# Patient Record
Sex: Male | Born: 2000 | Race: White | Hispanic: No | Marital: Single | Smoking: Never smoker
Health system: Southern US, Community
[De-identification: ages and names within clinical notes are randomized; demographics above are authoritative.]

## PROBLEM LIST (undated history)

## (undated) DIAGNOSIS — G600 Hereditary motor and sensory neuropathy: Principal | ICD-10-CM

## (undated) HISTORY — DX: Hereditary motor and sensory neuropathy: G60.0

---
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
## 2018-11-19 ENCOUNTER — Ambulatory Visit (INDEPENDENT_AMBULATORY_CARE_PROVIDER_SITE_OTHER): Payer: Self-pay | Admitting: Pediatrics

## 2018-12-03 ENCOUNTER — Ambulatory Visit (INDEPENDENT_AMBULATORY_CARE_PROVIDER_SITE_OTHER): Payer: Medicaid Other

## 2018-12-10 ENCOUNTER — Ambulatory Visit (INDEPENDENT_AMBULATORY_CARE_PROVIDER_SITE_OTHER): Payer: Medicaid Other | Admitting: Pediatrics

## 2018-12-10 ENCOUNTER — Encounter (INDEPENDENT_AMBULATORY_CARE_PROVIDER_SITE_OTHER): Payer: Self-pay | Admitting: Pediatrics

## 2018-12-10 VITALS — BP 120/90 | HR 82 | Ht 68.0 in | Wt 180.0 lb

## 2018-12-10 DIAGNOSIS — G600 Hereditary motor and sensory neuropathy: Secondary | ICD-10-CM

## 2018-12-10 NOTE — Progress Notes (Signed)
Patient: Mitchell Vasquez. MRN: [id number] Sex: male DOB: 2000/10/24  Provider: Wyline Copas, MD Location of Care: [facility]  Note type: Routine return visit  History of Present Illness: History from: father and patient  Enes Rokosz. is a 18 y.o. male with Charcot [name] Tooth diagnosed at age 1 who presents for routine follow up.  His primary concern today is that he is outgrown his AFOs and needs a new prescription.  His current AFOs are tight, but father denies any ulcers on his feet.  [name] reports that his neuropathy has slowly progressed and that his hand clawing is now worse.  He loves to play Xbox and his symptoms are not preventing him from doing so.  He continues to use an electric wheelchair and gets out of it to use the restroom and take a bath.  He supports himself with his walker or holding onto the wall.  This is no more difficult than it was in the past.  He had no falls at home.  He has remained very independent and is still able to perform all of his household ADLs.  He was previously receiving physical therapy at [facility], but is since graduated and is no longer receiving PT.  He did think that PT was helpful and would like to continue if able.  He was also previously receiving occupational therapy for job skills, but he thinks this is no longer necessary.  He is now enrolled in [employer] in an animal care program and hopes to be employed after finishing his program in September 2021.  Review of Systems: A complete review of systems was negative except as noted above.  Past Medical History Diagnosis Date  . 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 Initially this was thought to be of  unknown significance that over time it has become established as etiologic.  Birth History 9 lbs. 2 oz. infant born at 96 months gestational age to a 20 year old g 3 p 1 1 2  male. Gestation was uncomplicated cesarian section  Nursery Course was uncomplicated Growth and Development was recalled as abnormal(delayed walking)  Behavior History none  Surgical History History reviewed. No pertinent surgical history.  Family History family history is not on file. Mom T2DM (started after pregnancy), bipolar disorder Family history is negative for migraines, seizures, intellectual disabilities, blindness, deafness, birth defects, chromosomal disorder, or autism.  Social History Socioeconomic History  . Marital status: Single  . Years of education:  40  . Highest education level:  High school graduate  Occupational History  .  Not working  Social Needs  . Financial resource strain: Not on file  . Food insecurity:    Worry: Not on file    Inability: Not on file  . Transportation needs:    Medical: Not on file    Non-medical: Not on file  Tobacco Use  . Smoking status: Passive Smoke Exposure - Never Smoker  . Smokeless tobacco: Never Used  . Tobacco comment: Mom smokes inside  Substance and Sexual Activity  . Alcohol use: Not on file  . Drug use: Not on file  . 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    [name] lives with his parents and siblings.    [name] enjoys playing video games and eating.   No Known Allergies  Physical Exam BP 120/90   Pulse 82   Ht 5' 8"  (1.727 m)   Wt 180 lb (81.6 kg)   BMI 27.37 kg/m   General: alert, well developed, well nourished, in no acute distress, blond hair, brown eyes, right handed Head: normocephalic, no dysmorphic features Ears, Nose and Throat: Otoscopic: tympanic membranes normal; pharynx: oropharynx is pink without exudates or  tonsillar hypertrophy Neck: supple, full range of motion, no cranial or cervical bruits Respiratory: auscultation clear Cardiovascular: no murmurs, pulses are normal Musculoskeletal: Extremities appear atrophied, bilateral claw deformity of hands Skin: feet warm and well perfused. No ulcers or skin irritation of feet or between toes.  Neurologic Exam  Mental Status: alert; oriented to person, place and year; knowledge is normal for age; language is normal Cranial Nerves: visual fields are full to double simultaneous stimuli; extraocular movements are full and conjugate; pupils are round reactive to light; funduscopic examination shows sharp disc margins with normal vessels; symmetric facial strength; midline tongue and uvula Motor: Strength 5/5 in biceps, triceps. Strength 3/5 in flexion and extension of wrists. Strength 2/5 in flexion and extension of fingers. Strength 0/5 in plantar flexion and dorsiflexion of ankles bilaterally. Strength 0/5 in flexion and extension of knees. Sensory: no sensation to light tough on bilateral legs distal to the knees. Sensation otherwise intact. Coordination: Difficult to test Gait and Station: not assessed, in wheelchair Reflexes: Absent  Assessment 1. 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  Discussion Progressive worsening of bilateral claw hand contraction. Still able to perform ADLs, maintain independence, participate in hobbies. In need of new AFOs and desires to return to PT.   Plan - Ambulatory Referral for DME, [place] - Father to reach out to PT at [facility], will place order upon request - Return visit in 1 year for routine evaluation, I will see him sooner based on clinical need   Medication List  No prescribed medications.   The medication list was reviewed and reconciled. All changes or newly prescribed medications were explained.  A complete medication list was provided to  the patient/caregiver.  Harlon Ditty, MD [facility], PGY-1  Greater than 50% of a 25-minute visit was spent in counseling and coordination of care concerning his neuromuscular disorder.  I supervised Dr. Ralph Dowdy and agree with his note except as amended.  I performed physical examination, participated in history taking, and guided decision making.  Jodi Geralds MD

## 2018-12-10 NOTE — Progress Notes (Deleted)
Patient: Mitchell Vasquez. MRN: [id number] Sex: male DOB: 2001-03-22  Provider: Ellison Carwin, MD Location of Care: [facility]  Note type: Routine return visit  History of Present Illness: Referral Source: Mitchell Guard, MD History from: father, patient and [facility] chart Chief Complaint: Peripheral neuropathy/Charcot Marie-Tooth disease  Mitchell Vasquez. is a 18 y.o. male who ***  Review of Systems: A complete review of systems was unremarkable.  Past Medical History Past Medical History:  Diagnosis Date  . 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    ***  Birth History *** lbs. *** oz. infant born at *** weeks gestational age to a *** year old g *** p *** *** *** *** male. Gestation was {Complicated/Uncomplicated Pregnancy:20185} Mother received {CN Delivery analgesics:210120005}  {method of delivery:313099} Nursery Course was {Complicated/Uncomplicated:20316} Growth and Development was {cn recall:210120004}  Behavior History {Symptoms; behavioral problems:18883}  Surgical History History reviewed. No pertinent surgical history.  Family History family history is not on file. Family history is negative for migraines, seizures, intellectual disabilities, blindness, deafness, birth defects, chromosomal disorder, or autism.  Social History Social History   Socioeconomic History  . Marital status: Single    Spouse name: Not on file  . Number of children: Not on file  . Years of education: Not on file  . Highest education level: Not on file  Occupational History  . Not on file  Social Needs  . Financial resource strain: Not on file  . Food insecurity:    Worry: Not on file    Inability: Not on file  . Transportation needs:    Medical: Not on file    Non-medical: Not on file  Tobacco Use  .  Smoking status: Passive Smoke Exposure - Never Smoker  . Smokeless tobacco: Never Used  . Tobacco comment: Mom smokes inside  Substance and Sexual Activity  . Alcohol use: Not on file  . Drug use: Not on file  . Sexual activity: Not on file  Lifestyle  . Physical activity:    Days per week: Not on file    Minutes per session: Not on file  . Stress: Not on file  Relationships  . Social connections:    Talks on phone: Not on file    Gets together: Not on file    Attends religious service: Not on file    Active member of club or organization: Not on file    Attends meetings of clubs or organizations: Not on file    Relationship status: Not on file  Other Topics Concern  . 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   He attended [employer].   Mitchell Vasquez lives with his parents and siblings.   Mitchell Vasquez enjoys playing video games and eating.     Allergies No Known Allergies  Physical Exam BP 120/90   Pulse 82   Ht 5\' 8"  (1.727 m)   Wt 180 lb (81.6 kg)   BMI 27.37 kg/m   ***   Assessment   Discussion   Plan  Allergies as of 12/10/2018   No Known Allergies     Medication List    as of December 10, 2018 11:09 AM   You have not been prescribed any medications.     The medication list was reviewed and reconciled. All changes or newly  prescribed medications were explained.  A complete medication list was provided to the patient/caregiver.  Mitchell Geralds MD

## 2018-12-10 NOTE — Patient Instructions (Signed)
Glad that things are stable for Mitchell Vasquez.  I would be happy to order physical therapy in [place].  Go through your social services to find out if we can order this at [facility].  Please let me know if there is anything else that I can do to be of assistance.

## 2019-02-24 ENCOUNTER — Emergency Department
Admission: EM | Admit: 2019-02-24 | Discharge: 2019-02-24 | Disposition: A | Discharge status: Home / Self Care | Payer: Medicaid Other | Attending: Emergency Medicine | Admitting: Emergency Medicine

## 2019-02-24 ENCOUNTER — Emergency Department: Payer: Medicaid Other

## 2019-02-24 ENCOUNTER — Other Ambulatory Visit: Payer: Self-pay

## 2019-02-24 DIAGNOSIS — L03116 Cellulitis of left lower limb: Secondary | ICD-10-CM

## 2019-02-24 DIAGNOSIS — Z7722 Contact with and (suspected) exposure to environmental tobacco smoke (acute) (chronic): Secondary | ICD-10-CM | POA: Diagnosis not present

## 2019-02-24 DIAGNOSIS — M79672 Pain in left foot: Secondary | ICD-10-CM | POA: Diagnosis present

## 2019-02-24 DIAGNOSIS — Z79899 Other long term (current) drug therapy: Secondary | ICD-10-CM | POA: Insufficient documentation

## 2019-02-24 LAB — COMPREHENSIVE METABOLIC PANEL
ALT: 115 U/L — ABNORMAL HIGH (ref 0–44)
AST: 87 U/L — ABNORMAL HIGH (ref 15–41)
Albumin: 4.8 g/dL (ref 3.5–5.0)
Alkaline Phosphatase: 68 U/L (ref 38–126)
Anion gap: 11 (ref 5–15)
BUN: 11 mg/dL (ref 6–20)
CO2: 24 mmol/L (ref 22–32)
Calcium: 9.6 mg/dL (ref 8.9–10.3)
Chloride: 105 mmol/L (ref 98–111)
Creatinine, Ser: 0.35 mg/dL — ABNORMAL LOW (ref 0.61–1.24)
GFR calc Af Amer: >60 mL/min (ref >60)
GFR calc non Af Amer: >60 mL/min (ref >60)
Glucose, Bld: 116 mg/dL — ABNORMAL HIGH (ref 70–99)
Potassium: 4 mmol/L (ref 3.5–5.1)
Sodium: 140 mmol/L (ref 135–145)
Total Bilirubin: 1.9 mg/dL — ABNORMAL HIGH (ref 0.3–1.2)
Total Protein: 8.5 g/dL — ABNORMAL HIGH (ref 6.5–8.1)

## 2019-02-24 LAB — CBC WITH DIFFERENTIAL/PLATELET
Abs Immature Granulocytes: 0.04 10*3/uL (ref 0.00–0.07)
Basophils Absolute: 0.1 10*3/uL (ref 0.0–0.1)
Basophils Relative: 1 %
Eosinophils Absolute: 0.1 10*3/uL (ref 0.0–0.5)
Eosinophils Relative: 1 %
HCT: 44.7 % (ref 39.0–52.0)
Hemoglobin: 15.2 g/dL (ref 13.0–17.0)
Immature Granulocytes: 1 %
Lymphocytes Relative: 24 %
Lymphs Abs: 1.7 10*3/uL (ref 0.7–4.0)
MCH: 29 pg (ref 26.0–34.0)
MCHC: 34 g/dL (ref 30.0–36.0)
MCV: 85.1 fL (ref 80.0–100.0)
Monocytes Absolute: 0.5 10*3/uL (ref 0.1–1.0)
Monocytes Relative: 8 %
Neutro Abs: 4.6 10*3/uL (ref 1.7–7.7)
Neutrophils Relative %: 65 %
Platelets: 308 10*3/uL (ref 150–400)
RBC: 5.25 MIL/uL (ref 4.22–5.81)
RDW: 12.8 % (ref 11.5–15.5)
WBC: 7 10*3/uL (ref 4.0–10.5)
nRBC: 0 % (ref 0.0–0.2)

## 2019-02-24 LAB — LACTIC ACID, PLASMA: Lactic Acid, Venous: 2.5 mmol/L (ref 0.5–1.9)

## 2019-02-24 MED ORDER — CEPHALEXIN 500 MG PO CAPS: 500.0000 mg | ORAL_CAPSULE | Freq: Three times a day (TID) | ORAL | 0 refills | Status: DC

## 2019-02-24 MED ORDER — SULFAMETHOXAZOLE-TRIMETHOPRIM 800-160 MG PO TABS: 1.0000 | ORAL_TABLET | Freq: Two times a day (BID) | ORAL | 0 refills | Status: DC

## 2019-02-24 MED ORDER — SODIUM CHLORIDE 0.9% FLUSH: 3.0000 mL | Freq: Once | INTRAVENOUS | Status: DC

## 2019-02-24 NOTE — ED Notes (Signed)
AAOx3.  Skin warm and dry.  NAD 

## 2019-02-24 NOTE — ED Provider Notes (Addendum)
[facility] Emergency Department Provider Note  ____________________________________________  Time seen: Approximately 2:39 PM  I have reviewed the triage vital signs and the nursing notes.   HISTORY  Chief Complaint Wound Infection    HPI Mitchell Vasquez. is a 18 y.o. male with a history of Charcot-Marie-Tooth syndrome who complains of pain in the top of the left foot, gradual onset and worsening over the past month.  Denies fever chills chest pain shortness of breath body aches or fatigue.  Otherwise in his usual state of health.  Currently does not take any medications.  No aggravating or alleviating factors to the pain.  Nonradiating.  He has been applying a topical antibiotic ointment without relief.      Past Medical History:  Diagnosis Date  . 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 No pertinent surgical history.   Prior to Admission medications   Medication Sig Start Date End Date Taking? Authorizing Provider  cephALEXin (KEFLEX) 500 MG capsule Take 1 capsule (500 mg total) by mouth 3 (three) times daily. 02/24/19   Sharman Cheek, MD  sulfamethoxazole-trimethoprim (BACTRIM DS) 800-160 MG tablet Take 1 tablet by mouth 2 (two) times daily. 02/24/19   Sharman Cheek, MD     Allergies Patient has no known allergies.   No family history on file.  Social History Social History   Tobacco Use  . Smoking status: Passive Smoke Exposure - Never Smoker  . Smokeless tobacco: Never Used  . Tobacco comment: Mom smokes inside  Substance Use Topics  . Alcohol use: Not Currently    Alcohol/week: 0.0 standard drinks  . Drug use: Not Currently    Review of Systems  Constitutional:   No fever or chills.  ENT:   No sore throat. No  rhinorrhea. Cardiovascular:   No chest pain or syncope. Respiratory:   No dyspnea or cough. Gastrointestinal:   Negative for abdominal pain, vomiting and diarrhea.  Musculoskeletal:   Left foot pain as above. All other systems reviewed and are negative except as documented above in ROS and HPI.  ____________________________________________   PHYSICAL EXAM:  VITAL SIGNS: ED Triage Vitals [02/24/19 1239]  Enc Vitals Group     BP      Pulse      Resp      Temp      Temp src      SpO2      Weight 165 lb (74.8 kg)     Height 6' (1.829 m)     Head Circumference      Peak Flow      Pain Score 5     Pain Loc      Pain Edu?      Excl. in GC?     Vital signs reviewed, nursing assessments reviewed.   Constitutional:   Alert and oriented. Non-toxic appearance. Eyes:   Conjunctivae are normal. EOMI. PERRL. ENT      Head:   Normocephalic and atraumatic.        Neck:   No meningismus. Full ROM. Hematological/Lymphatic/Immunilogical:   No cervical lymphadenopathy. Cardiovascular:   RRR. Symmetric bilateral radial and DP pulses.  No murmurs. Cap refill less than 2 seconds. Respiratory:   Normal respiratory effort without tachypnea/retractions. Breath sounds are clear and equal bilaterally. No wheezes/rales/rhonchi. Gastrointestinal:  Soft and nontender. Non distended. There is no CVA tenderness.  No rebound, rigidity, or guarding. Musculoskeletal:   Normal range of motion in all extremities. No joint effusions.  No lower extremity tenderness.  No edema. Neurologic:   Normal speech and language.  Motor grossly intact. No acute focal neurologic deficits are appreciated.  Skin:    Skin is warm and dry.  There is a small crack in the skin at the webspace between the fourth and fifth toes on the left foot with surrounding inflammation covering about a 2 cm diameter.  No induration crepitus or fluctuance or purulent drainage.  Not hot to touch.  Blanches normally.  Toes are normal.  Right  foot is normal.  No embolic findings on hands.  ____________________________________________    LABS (pertinent positives/negatives) (all labs ordered are listed, but only abnormal results are displayed) Labs Reviewed  LACTIC ACID, PLASMA - Abnormal; Notable for the following components:      Result Value   Lactic Acid, Venous 2.5 (*)    All other components within normal limits  COMPREHENSIVE METABOLIC PANEL - Abnormal; Notable for the following components:   Glucose, Bld 116 (*)    Creatinine, Ser 0.35 (*)    Total Protein 8.5 (*)    AST 87 (*)    ALT 115 (*)    Total Bilirubin 1.9 (*)    All other components within normal limits  CBC WITH DIFFERENTIAL/PLATELET  LACTIC ACID, PLASMA  URINALYSIS, COMPLETE (UACMP) WITH MICROSCOPIC   ____________________________________________   EKG    ____________________________________________    RADIOLOGY  No results found.  ____________________________________________   PROCEDURES Procedures  ____________________________________________    CLINICAL IMPRESSION / ASSESSMENT AND PLAN / ED COURSE  Medications ordered in the ED: Medications  sodium chloride flush (NS) 0.9 % injection 3 mL (3 mLs Intravenous Not Given 02/24/19 1405)    Pertinent labs & imaging results that were available during my care of the patient were reviewed by me and considered in my medical decision making (see chart for details).  Mitchell Sine. was evaluated in Emergency Department on 02/24/2019 for the symptoms described in the history of present illness. He was evaluated in the context of the global COVID-19 pandemic, which necessitated consideration that the patient might be at risk for infection with the SARS-CoV-2 virus that causes COVID-19. Institutional protocols and algorithms that pertain to the evaluation of patients at risk for COVID-19 are in a state of rapid change based on information released by regulatory bodies including the CDC  and federal and state organizations. These policies and algorithms were followed during the patient's care in the ED.   Patient presents with an area of pain and redness on the left foot.  Vital signs are normal.  Appears to be simple cellulitis that is not responding to topical antibiotics ointment.  He denies any allergies.  He is nontoxic and well-appearing, not septic, labs are reassuring.  Lactate of 2.5 is nonspecific and difficult to interpret in this chronically wheelchair-bound patient.  He is suitable for oral antibiotics and outpatient follow-up with primary care.  I do not see any reason to suspect osteomyelitis, abscess, necrotizing fasciitis or embolic phenomenon.  No evidence of endocarditis or any other source with hematogenous spread.      ____________________________________________   FINAL CLINICAL IMPRESSION(S) / ED DIAGNOSES    Final diagnoses:  Cellulitis of left lower extremity     ED Discharge Orders  Ordered    cephALEXin (KEFLEX) 500 MG capsule  3 times daily     02/24/19 1439    sulfamethoxazole-trimethoprim (BACTRIM DS) 800-160 MG tablet  2 times daily     02/24/19 1439          Portions of this note were generated with dragon dictation software. Dictation errors may occur despite best attempts at proofreading.   Sharman CheekStafford, Shadi Larner, MD 02/24/19 1443    Sharman CheekStafford, Kaly Mcquary, MD 02/24/19 1515

## 2019-02-24 NOTE — ED Triage Notes (Signed)
Pt states he cut his left foot in April and is now red inflamed with drainage and swelling,. Pt has a genetic disorder and is unable to feel pain or move below the knees, pt is wheelchair bound. Pt is able to answer all question appropriately.

## 2020-01-01 ENCOUNTER — Other Ambulatory Visit: Payer: Self-pay

## 2020-01-01 ENCOUNTER — Encounter (INDEPENDENT_AMBULATORY_CARE_PROVIDER_SITE_OTHER): Payer: Self-pay | Admitting: Pediatrics

## 2020-01-01 ENCOUNTER — Ambulatory Visit (INDEPENDENT_AMBULATORY_CARE_PROVIDER_SITE_OTHER): Payer: Medicaid Other | Admitting: Pediatrics

## 2020-01-01 VITALS — BP 130/80 | HR 104 | Ht 68.0 in | Wt 185.0 lb

## 2020-01-01 DIAGNOSIS — G600 Hereditary motor and sensory neuropathy: Secondary | ICD-10-CM

## 2020-01-01 NOTE — Patient Instructions (Signed)
Thank you for coming today.  I am glad that Mitchell Vasquez is doing well.  He needs to get up and walk every day using his walker if need be with assistance so that he does not fall.  This is necessary in order to preserve his strength, to keep him from developing increasing scoliosis, and to keep his cardiovascular tone intact.  Walking accomplishes all these things.  This should be done a few times a day in a controlled setting.  I will see him again in 1 year.  As I told you, I will retire from practice, planned date July 14, 2021.  We need to contact the [facility] to arrange for transfer of his care.  If there is any concern about this, sending him to the [facility] or [facility] clinics would be a very good option as well.

## 2020-01-01 NOTE — Progress Notes (Signed)
Patient: Mitchell Vasquez. MRN: [id number] Sex: male DOB: [birth year]  Provider: Wyline Copas, MD Location of Care: [facility] Child Neurology  Note type: Routine return visit  History of Present Illness: Referral Source: Claris Gower, MD History from: father, patient and [facility] chart Chief Complaint: Mitchell Vasquez disease  Mitchell Vasquez. is a 19 y.o. male who was evaluated January 01, 2020 for the first time since December 10, 2018.  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  His course has been slowly progressive over the years.  He has difficulty walking and requires AFOs.  I wrote a prescription when I saw him over a year ago which his father did not fill.  The prescription has expired and he returns 13 months later for evaluation and for another prescription.  He does not get up and walk very much with a walker.  I explained to his father that this is problematic because he will become progressively weaker.  He will also develop increasing problems with scoliosis, and problems with his cardiothoracic system.  This can all be minimized if he will get up and walk for relatively short periods of time throughout the day.  Since there are people at home (mother, brother, and sister) who can spot him, there is no reason for him to remain in his wheelchair.  He was here today with his father who works outside the home.  He receives his AFOs through [employer] in [place].  They do not fit well which is one other reason that he does not like being upright bearing weight on his feet.  In general his health is good.  He is independent in matters of hygiene, dressing, feeding himself.  He lives at home with his parents.  As best I know, he is not going to school.  Review of Systems: A complete review of systems was remarkable for patient is here to be seen for Mitchell Mitchell Vasquez tooth  disease. Father reports that the patient has been doing well. He states that the patient needs a new prescription for his AFO's. He states that he does not have any concerns for today's visit., all other systems reviewed and negative.  Past Medical History Diagnosis Date  . 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 Initially this was thought to be of unknown significance that over time it has become established as etiologic.  Birth History 9 lbs. 2 oz. infant born at 49 months gestational age to a 21 year old g 3 p 1 1 2  male. Gestation was uncomplicated cesarian section  Nursery Course was uncomplicated Growth and Development was recalled as abnormal(delayed walking)  Behavior History none  Surgical History History reviewed. No pertinent surgical history.  Family History family history is not on file. Family history is negative for migraines, seizures, intellectual disabilities, blindness, deafness, birth defects, chromosomal disorder, or autism.  Social History Socioeconomic History  . Marital status: Single  . Years of education:  59  . Highest education level:  High school graduate  Occupational History  . Not employed  Tobacco Use  . Smoking status: Passive Smoke Exposure - Never Smoker  . Smokeless tobacco: Never Used  . Tobacco  comment: Mom smokes inside  Substance and Sexual Activity  . Alcohol use: Not Currently    Alcohol/week: 0.0 standard drinks  . Drug use: Not Currently  . 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    He attended [employer].    Mitchell Vasquez lives with his parents and siblings.    Mitchell Vasquez enjoys playing video games and eating.   No Known  Allergies  Physical Exam BP 130/80   Pulse (!) 104   Ht 5\' 8"  (1.727 m)   Wt 185 lb (83.9 kg)   BMI 28.13 kg/m   General: alert, well developed, well nourished, in no acute distress, sandy hair, brown eyes, left handed Head: normocephalic, no dysmorphic features Ears, Nose and Throat: Otoscopic: tympanic membranes normal; pharynx: oropharynx is pink without exudates or tonsillar hypertrophy Neck: supple, full range of motion, no cranial or cervical bruits Respiratory: auscultation clear Cardiovascular: no murmurs, pulses are normal Musculoskeletal: no apparent scoliosis; he has atrophy distally in his arms and legs he has claw hand deformities bilaterally Skin: no rashes or neurocutaneous lesions  Neurologic Exam  Mental Status: alert; oriented to person, place and year; knowledge is normal for age; language is normal Cranial Nerves: visual fields are full to double simultaneous stimuli; extraocular movements are full and conjugate; pupils are round reactive to light; funduscopic examination shows sharp disc margins with normal vessels; symmetric facial strength; midline tongue and uvula; air conduction is greater than bone conduction bilaterally Motor: 5/5 strength in his deltoids, biceps, and triceps, 3 were 5 in flexion extension of his wrist on the left, 1-2 on the right 1/5 in flexion extension of his fingers 05 and abduction; 2/5 in extension and flexion at his knees, I was unable to test his feet because they were in his AFOs and we did not want to take them off.  2/5 proximally in his legs Sensory: stocking and glove hypnesthesia Coordination: good finger-to-nose, rapid repetitive alternating movements and finger apposition Gait and Station: due to his weakness I did not get him up to walk him.  I hope that he will be able to do so once he has properly fitting AFOs but he has very significant weakness in his legs. Reflexes: areflexic  Assessment 1.  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  Discussion This has been slowly progressive.  He still able to sleep function fairly independently with his activities of daily living.  He is not walking enough and over long-term if this continues there will be increasing problems with weakness in his legs, scoliosis, and cardiorespiratory deconditioning.  Plan I prescribed AFOs and gave a copy to his father.  He had a face-to-face with me today and this is medically necessary for his ability to walk, proper positioning of his feet and legs, limiting any further deformity of his feet and ankles, and custom fit based on changes in his legs over the past couple of years.   Medication List   Accurate as of January 01, 2020  9:11 AM. If you have any questions, ask your nurse or doctor.    cephALEXin 500 MG capsule Commonly known as: KEFLEX Take 1 capsule (500 mg total) by mouth 3 (three) times daily.   sulfamethoxazole-trimethoprim 800-160 MG tablet Commonly known as: Bactrim DS Take 1 tablet by mouth 2 (two) times daily.    The medication list was reviewed and reconciled. All changes or newly prescribed medications were  explained.  A complete medication list was provided to the patient/caregiver.  Jodi Geralds MD

## 2020-07-27 ENCOUNTER — Telehealth (INDEPENDENT_AMBULATORY_CARE_PROVIDER_SITE_OTHER): Payer: Self-pay | Admitting: Pediatrics

## 2020-07-27 NOTE — Telephone Encounter (Signed)
Thank you we will do a face-to-face on October 20.

## 2020-07-27 NOTE — Telephone Encounter (Signed)
  Who's calling (name and relationship to patient) : Darrien (dad)  Best contact number: [phone]  Provider they see: Dr. Sharene Skeans  Reason for call: Dad wants to know if referral has been made to adult neuro.    PRESCRIPTION REFILL ONLY  Name of prescription:  Pharmacy:

## 2020-07-27 NOTE — Telephone Encounter (Signed)
Patient has been scheduled for August 03, 2020 @ 10:15. Father states that the patient's wheelchair has stopped working and insurance has requested a face to face to get a new wheelchair

## 2020-08-03 ENCOUNTER — Ambulatory Visit (INDEPENDENT_AMBULATORY_CARE_PROVIDER_SITE_OTHER): Payer: Medicaid Other | Admitting: Pediatrics

## 2020-08-09 ENCOUNTER — Ambulatory Visit (INDEPENDENT_AMBULATORY_CARE_PROVIDER_SITE_OTHER): Payer: Medicaid Other | Admitting: Pediatrics

## 2020-09-19 ENCOUNTER — Encounter (INDEPENDENT_AMBULATORY_CARE_PROVIDER_SITE_OTHER): Payer: Self-pay | Admitting: Pediatrics

## 2020-09-19 ENCOUNTER — Other Ambulatory Visit: Payer: Self-pay

## 2020-09-19 ENCOUNTER — Ambulatory Visit (INDEPENDENT_AMBULATORY_CARE_PROVIDER_SITE_OTHER): Payer: Medicaid Other | Admitting: Pediatrics

## 2020-09-19 VITALS — BP 116/84 | HR 84 | Wt 190.0 lb

## 2020-09-19 DIAGNOSIS — G600 Hereditary motor and sensory neuropathy: Secondary | ICD-10-CM | POA: Diagnosis not present

## 2020-09-19 NOTE — Patient Instructions (Signed)
It was a pleasure to see you today.  We had a face-to-face to determine medical necessity for motorized wheelchair.  Please get the information to me from the company that supplies a durable medical goods that will include a complete invoice for the chair a Medicaid document for certificate of medical need all this has to be delivered and signed after this visit.  In my opinion the motorized wheelchair is medically necessary.  I also will fill out the form for a handicap placard.  Mitchell Vasquez's condition makes it impossible hip for him to walk any distance at all.  I also am very pleased that you are going to consider getting the Covid vaccine.  I think that this is the best way to prevent serious illness from Covid.  I would like to see you again in 6 months.  Our plan right now is to check with [facility] clinic and see if they will follow him long-term.  There is a possibility that he may need to go to an adult neurologist at [facility] or [facility].  This week these would be neuromuscular specialist who deal specifically in conditions like Charcot-Marie-Tooth.

## 2020-09-19 NOTE — Progress Notes (Signed)
Patient: Mitchell Vasquez. MRN: [id number] Sex: male DOB: [birth year]  Provider: Ellison Carwin, MD Location of Care: [facility] Child Neurology  Note type: Routine return visit  History of Present Illness: Referral Source: Windle Guard, MD History from: father, patient and [facility] chart Chief Complaint: Charcot marie Tooth disease  Mitchell Vasquez. is a 19 y.o. male who was evaluated September 19, 2020 for the first time since January 01, 2020.  He has a form of Charcot-Marie-Tooth caused by the lesion in the MFN2 gene.  His condition was diagnosed at age 66.  His course has been slowly progressive over the years.  He is unable to walk more than a couple of steps with significant assistance.  Most of his movement around the home and in the community is by wheelchair.  He comes today for a face-to-face evaluation to determine the medical necessity for a motorized wheelchair.  He previously had a motorized wheelchair which could not be repaired and now qualifies for another.  This is medically necessary equipment.  He has very severe weakness in his hands more than proximal arms as well as his legs.  He is unable to propel a portable wheelchair.  He has strength and coordination that allow him to use a joystick to fully control a motorized wheelchair.  His home will accommodate the motorized wheelchair for movement into all rooms where he needs to be.  This will be used not only for movement in his home but also in the community.  His family has an SUV and will modify the rear of the car by placing a platform in order to carry the wheelchair.  In addition, he will spend much of the day in the chair the chair needs to be made comfortable so that he can sit for long times and be properly positioned to eat and carry out other activities of daily living.  He was recently received new AFOs through [facility] in [place] which now fit well he also has shoes that zip to accommodate the foot  and AFO.  In general his health is good.  He has normal sleep patterns.  He has now received the COVID vaccine though his father has.  He is independent matters of hygiene, dressing, and feeding himself.  He lives at home with his parents.  He is not employed.  That he spends much of the day watching TV and playing video games.  In addition to the face-to-face, his father requested a handicap placard.  I do not have these in the office father will have to procure it from department of vehicles and I will be happy to fill it out.  No other concerns were raised today.   Review of Systems: A complete review of systems was remarkable for patient is here to be seen for charcot marie tooth disease. Father reports that the patient has been doing well. He states that they are here for a face to fae evalutaion due to the patient needing anew wheelchair. He also states that they are in need of a handicap placard. He has no other concerns at this time, all other systems reviewed and negative.  Past Medical History Past Medical History:  . 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 Initially this  was thought to be of unknown significance that over time it has become established as etiologic.  Birth History 9 lbs. 2 oz. infant born at 24 months gestational age to a 21 year old g 3 p 1 1 2  male. Gestation was uncomplicated cesarian section  Nursery Course was uncomplicated Growth and Development was recalled as abnormal(delayed walking)  Behavior History none  Surgical History History reviewed. No pertinent surgical history.  Family History family history is not on file. Family history is negative for migraines, seizures, intellectual  disabilities, blindness, deafness, birth defects, chromosomal disorder, or autism.  Social History Socioeconomic History  . Years of education:  97  . Highest education level:  Unclear if he had a certificate or a diploma  Occupational History  . Not employed  Tobacco Use  . Smoking status: Passive Smoke Exposure - Never Smoker  . Smokeless tobacco: Never Used  . Tobacco comment: Mom smokes inside  Substance and Sexual Activity  . Alcohol use: Not Currently    Alcohol/week: 0.0 standard drinks  . Drug use: Not Currently  . Sexual activity: Not on file  Social History Narrative    12 is a high [name].    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    [name] lives with his parents and siblings.    Mitchell Vasquez's enjoys playing video games and eating.   No Known Allergies  Physical Exam BP 116/84   Pulse 84   Wt 190 lb (86.2 kg)   BMI 28.89 kg/m   General: alert, well developed, well nourished, in no acute distress, sandy hair, brown eyes, left handed Head: normocephalic, no dysmorphic features Ears, Nose and Throat: Otoscopic: tympanic membranes normal; pharynx: oropharynx is pink without exudates or tonsillar hypertrophy Neck: supple, full range of motion, no cranial or cervical bruits Respiratory: auscultation clear Cardiovascular: no murmurs, pulses are normal Musculoskeletal: Severe atrophy of his thenar and hyperthenar eminences, the dorsum of his hand with claw hand deformities, tight heel cords, and decreased motion of his legs more so than his arms; severe distal atrophy of his calves and shins which is hidden by the AFOs; 5/5 strength in his deltoids 4/5 in the biceps and triceps, 4 - in his left wrist dorsiflexors, 0/5 in the wrist bilaterally otherwise and trace movements of his fingers.  He can grip fairly tightly with all fingers fingers closed; 2/5 proximal movements of his hips, 1/5 in his knee extensors and flexors; 0/5 distally Skin: no rashes or neurocutaneous  lesions  Neurologic Exam  Mental Status: alert; oriented to person, place and year; knowledge is normal for age; language is normal Cranial Nerves: visual fields are full to double simultaneous stimuli; extraocular movements are full and conjugate; pupils are round reactive to light; funduscopic examination shows sharp disc margins with normal vessels; symmetric facial strength; midline tongue and uvula; air conduction is greater than bone conduction bilaterally Motor: He has normal or near normal strength in his deltoids and moderate weakness in his biceps and triceps, and he has severe weakness in his wrist extensors and flexors and intrinsic muscles of his hands. Sensory: intact responses to cold, vibration, proprioception; stereognosis is intact only for large objects Coordination: nnable to test Gait and Station: wheelchair-bound Reflexes: symmetric and absent bilaterally; no clonus; bilateral flexor plantar responses  Assessment 1.  Charcot-Marie-Tooth associated with mutation of the MFN2 gene, G60.0  Discussion It is medically necessary for [name] to have a motorized wheelchair.  He is unable to propel a portable wheelchair.  It  is medically necessary for him to be safely transported at home and in the community.  His home can accommodate the device.  His family has devised a means for transporting it within the community.  His condition is not going to improve and therefore he will be more more dependent upon this form of ambulation with time.  Plan I recommended the father discuss with his durable medical goods provider purchase of an motorized wheelchair.  I told her to make certain that the provider sent the invoice for the entire chair including all accessories.  We also need to have a [facility] form for medical necessity.  Greater than 50% of a 30-minute visit was spent in counseling and coordination of care concerning his history of weakness, his worsening upper  extremity strength and assessing the medical necessity for the chair.  I also will fill out a division of motor vehicles placard for handicap parking when it is available.  He will return to see me in 6 months' time.  We need to make transition for him as I move into retirement July 14, 2021.   Medication List   Accurate as of September 19, 2020 10:39 AM. If you have any questions, ask your nurse or doctor.    cephALEXin 500 MG capsule Commonly known as: KEFLEX Take 1 capsule (500 mg total) by mouth 3 (three) times daily.   sulfamethoxazole-trimethoprim 800-160 MG tablet Commonly known as: Bactrim DS Take 1 tablet by mouth 2 (two) times daily.    The medication list was reviewed and reconciled. All changes or newly prescribed medications were explained.  A complete medication list was provided to the patient/caregiver.  Deetta Perla MD

## 2021-02-02 ENCOUNTER — Telehealth (INDEPENDENT_AMBULATORY_CARE_PROVIDER_SITE_OTHER): Payer: Self-pay | Admitting: Pediatrics

## 2021-02-02 NOTE — Telephone Encounter (Signed)
I called patient's father and he states that the visit for December was only to discuss chair. When forms were sent in and returned medicaid stated that they had nothing in the note sent about the wheelchair. I let father know to call them back and have them resend documentation so that we could make sure that the December note from Dr. Sharene Skeans where there is talk about a wheelchair is sent in. Father agreed.

## 2021-02-02 NOTE — Telephone Encounter (Signed)
  Who's calling (name and relationship to patient) :Dad/ Wylie Hail   Best contact number:[phone]  Provider they see:Dr. Sharene Skeans   Reason for call:Dad called requesting a call back with questions he has about a chair that he is supposed to be getting and is having problems getting this chair. Please call dad back     PRESCRIPTION REFILL ONLY  Name of prescription:  Pharmacy:

## 2021-02-06 NOTE — Telephone Encounter (Signed)
Paper work has been faxed

## 2021-02-16 ENCOUNTER — Encounter (INDEPENDENT_AMBULATORY_CARE_PROVIDER_SITE_OTHER): Payer: Self-pay

## 2021-03-20 ENCOUNTER — Ambulatory Visit (INDEPENDENT_AMBULATORY_CARE_PROVIDER_SITE_OTHER): Payer: Medicaid Other | Admitting: Pediatrics

## 2021-03-29 NOTE — Progress Notes (Signed)
Patient: Mitchell Vasquez. MRN: [id number] Sex: male DOB: [birth year]  Provider: Ellison Carwin, MD Location of Care: [facility]  Note type: Routine return visit  History of Present Illness: Referral Source: Kaleen Mask, MD History from: father and [facility] chart Chief Complaint: Charcot-Marie-Tooth disorder  Mitchell Matters. is a 20 y.o. male who returns March 30, 2021 for the first time since September 19, 2020.  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  His course has been relentlessly progressive.  He is unable to walk more than a couple of steps without significant assistance.  He gets around home and community in a motorized wheelchair.  He comes today for a face-to-face evaluation to determine the medical necessity for a motorized wheelchair.  We performed this September 19, 2020 but unfortunately I did not receive the certifying paperwork until well over 6 months after I saw him which was quite disappointed.  His father's been told that the request for wheelchair has progressed and the paperwork has been sent in.  I asked him to come back because in my experience when the face-to-face is carried more than 6 months before the paperwork is submitted, it is usually rejected.  His motorized wheelchair could not be repaired he qualifies for another.  This is medically necessary.  He has severe weakness in his hands or in the proximal arms and legs.  He is unable to propel a portable wheelchair but has strength and coordination allow him to use a joystick to fully control a motorized wheelchair.  His home will accommodate the motorized wheelchair for movement into all rooms where he needs to be.  This will also assist safe transportation and movement about his community.  The family has an SUV and modifies to the rear of the car by placing a platform in order to carry the wheelchair.    He spends much  of the day in the chair and therefore he needs to be made comfortable and position him so that he can sit for long periods of time and be properly position to limit scoliosis and to allow him to eat and carry out other activities of daily living.  He also has ankle-foot orthoses that were revised in the fall 2021 and fit well.  His general health is good.  He has normal sleep patterns.  He goes to bed at 11 PM typically falls asleep within an hour and gets up around 9 AM.  He is independent for most of his hygiene for dressing except his shoes and for feeding himself.  He spends much of his day watching TV and playing video games.  Review of Systems: A complete review of systems was remarkable for charcot marie tooth disease, all other systems reviewed and negative.  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  Initially this was thought to be a variant of unknown significance that over time it has become established as etiologic.   Birth History 9 lbs. 2 oz. infant born at 7 months gestational age to a 22 year old g 3 p 1 1 2   male. Gestation was uncomplicated cesarian section Nursery Course was  uncomplicated Growth and Development was recalled as  abnormal (delayed walking)   Behavior History none  Surgical History History reviewed. No pertinent surgical history.  Family History family history is not on file. Family history is negative for migraines, seizures, intellectual disabilities, blindness, deafness, birth defects, chromosomal disorder, or autism.  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   He attended [employer].   Mitchell Vasquez lives with his parents and siblings.   Mitchell Vasquez enjoys playing video games and eating.   No Known Allergies  Physical Exam BP 110/74   Pulse 70   Ht 5\' 11"  (1.803 m) Comment: dad reported  Wt 190 lb (86.2 kg) Comment: dad reported  BMI 26.50 kg/m   General: alert, well developed, well nourished, in no acute distress, s2andy hair, brown eyes, left handed Head: normocephalic, no dysmorphic features Ears, Nose and Throat: Otoscopic: tympanic membranes normal; pharynx: oropharynx is pink without exudates or tonsillar hypertrophy Neck: supple, full range of motion, no cranial or cervical bruits Respiratory: auscultation clear Cardiovascular: no murmurs, pulses are normal Musculoskeletal: Severe atrophy of his thenar and hyperthenar eminences, the dorsum of his hand with claw hand deformities, tight heel cords, and decreased motion of his legs more so than his arms; severe distal atrophy of his calves and shins which is hidden by the AFOs; clawhand deformities of his fingers.  He is unable to extend them and can minimally flex them. Skin: no rashes or neurocutaneous lesions  Neurologic Exam  Mental Status: alert; oriented to person, place and year; knowledge is normal for age; language is normal Cranial Nerves: visual fields are full to double simultaneous stimuli; extraocular movements are full and conjugate; pupils are round reactive to light; funduscopic examination shows sharp disc margins with normal vessels; symmetric facial strength; midline tongue and uvula;  air conduction is greater than bone conduction bilaterally Motor: 5/5 strength in his deltoids 4/5 in the biceps and triceps, 4 - in his left wrist dorsiflexors, 0/5 in the wrist bilaterally otherwise and trace movements of his fingers.  He can grip fairly tightly with all fingers fingers closed; 2/5 proximal movements of his hips, 1/5 in his knee extensors and flexors; 0/5 distally Sensory: intact responses to cold, vibration, proprioception and stereognosis for large objects Coordination: unable to test Gait and Station: wheelchair-bound Reflexes: symmetric and absent bilaterally; no clonus; bilateral flexor plantar responses   Assessment 1.  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  I asked his father to bring him back to make certain that we have appropriate documentation in case the documentation 6 months ago is not excepted.  Plan We will refer him to the [facility] for long-term care. Greater than 50% of a 20-minute visit was spent in counseling and coordination of care and discussing transition of care.. Medication List        Accurate  as of March 30, 2021  6:24 PM. If you have any questions, ask your nurse or doctor.       No prescribed medications    The medication list was reviewed and reconciled. All changes or newly prescribed medications were explained.  A complete medication list was provided to the patient/caregiver.  Deetta Perla MD

## 2021-03-30 ENCOUNTER — Ambulatory Visit (INDEPENDENT_AMBULATORY_CARE_PROVIDER_SITE_OTHER): Payer: Medicaid Other | Admitting: Pediatrics

## 2021-03-30 ENCOUNTER — Other Ambulatory Visit: Payer: Self-pay

## 2021-03-30 ENCOUNTER — Encounter (INDEPENDENT_AMBULATORY_CARE_PROVIDER_SITE_OTHER): Payer: Self-pay | Admitting: Pediatrics

## 2021-03-30 VITALS — BP 110/74 | HR 70 | Ht 71.0 in | Wt 190.0 lb

## 2021-03-30 DIAGNOSIS — G600 Hereditary motor and sensory neuropathy: Secondary | ICD-10-CM | POA: Diagnosis not present

## 2021-03-30 NOTE — Patient Instructions (Signed)
It was a pleasure to see you today.  I am sorry that we needed to schedule this.  The paperwork for his motorized wheelchair came through 2 days after a 96-month visit that we had back in December.  I know the paperwork has been submitted, but Medicaid could reject it.  We need to have a face-to-face that will allow me to complete the paperwork again so that he gets his wheelchair.  Please reach out to me and let me know if you got the wheelchair.  We will make a referral to the [facility] clinic.  I hope that we can have him seen within the next 6 months.  If there is anything that comes up between now and September 30 I will be happy to take care of him.  In the interim between my retirement and when he is seen at [facility] my partners will help in any way that we can.

## 2021-04-24 ENCOUNTER — Telehealth (INDEPENDENT_AMBULATORY_CARE_PROVIDER_SITE_OTHER): Payer: Self-pay | Admitting: Pediatrics

## 2021-04-24 DIAGNOSIS — G600 Hereditary motor and sensory neuropathy: Secondary | ICD-10-CM

## 2021-04-28 NOTE — Telephone Encounter (Signed)
Requested transfer of care to [facility]. 

## 2022-06-21 ENCOUNTER — Telehealth (INDEPENDENT_AMBULATORY_CARE_PROVIDER_SITE_OTHER): Payer: Self-pay | Admitting: Family

## 2022-06-21 NOTE — Telephone Encounter (Signed)
I received a form for Dr Sharene Skeans to be completed for wheelchair repair for patient. I called and left a message that Dr Sharene Skeans has retired and that the patient has not been seen in this office since June 2022. I asked for Ms. Southard to return my call. TG

## 2022-06-28 NOTE — Telephone Encounter (Signed)
I have not received a call back. I will await her call. TG

## 2022-07-09 ENCOUNTER — Telehealth (INDEPENDENT_AMBULATORY_CARE_PROVIDER_SITE_OTHER): Payer: Self-pay | Admitting: Pediatrics

## 2022-07-09 DIAGNOSIS — G600 Hereditary motor and sensory neuropathy: Secondary | ICD-10-CM

## 2022-07-09 NOTE — Telephone Encounter (Signed)
  Name of who is calling:  Herbie Baltimore Sr  Caller's Relationship to Patient:dad   Best contact number:[phone]  Provider they see: Prev Dr. Alferd Apa  Reason for call: Dr. Faylene Million put in a referral for Gabriell to go to [facility] and now they arent seeing the referral . Would like for it to be resent.      PRESCRIPTION REFILL ONLY  Name of prescription:  Pharmacy:

## 2022-07-09 NOTE — Telephone Encounter (Signed)
[place], the order has been placed for referral to [facility]. Please let Dad know and recommend that he call them to schedule an appointment after you send the referral. Thanks, Otila Kluver

## 2022-07-11 NOTE — Telephone Encounter (Signed)
Contacted pt's father.  Verified pt's name and DOB as well as father's name.  Informed dad that the referral was faxed over successfully to [facility]. Advised that he give them a call later in the day or sometime tomorrow to verify that they received the referral.   Dad verbalized understanding of this.   SS, CCMA

## 2022-07-18 ENCOUNTER — Telehealth (INDEPENDENT_AMBULATORY_CARE_PROVIDER_SITE_OTHER): Payer: Self-pay | Admitting: Family

## 2022-07-18 NOTE — Telephone Encounter (Signed)
  Name of who is calling:Codylee   Caller's Relationship to Patient:Father   Best contact number:[phone]  Provider they JKK:[facility]   Reason for call:Dad called regarding the referral that was sent to [facility] and they stated that they have not received it and could have possibly been sent to the wrong fax. Dad asked if it could be re faxed to the number listed below.  [phone]   PRESCRIPTION REFILL ONLY  Name of prescription:  Pharmacy:

## 2022-07-18 NOTE — Telephone Encounter (Signed)
[facility] to verify their fax number.  [phone] was the correct fax number.  Faxed a referral on 9/27 to the fax number above. Received a "Success" result on the Fax Call report.   Attempted to fax again 07/18/22 to the fax number dad provided. The fax number that dad provided was incorrect. Received a "No Answer" Result on the Fax Call Report.  The representative I spoke to, Harle Battiest, Confirmed their fax number, [phone]) I informed her that a successful fax was sent to that fax number on 9/27. She stated that though we may have received that confirmation on our end, they have not received the fax.   Informed her that I would fax again.  Called dad to let him know what was going on but, before I could state anything he'd told me that he'd just received a phone call from [facility] informing him that they did receive the fax after all.   Second fax had already been faxed over, have not received a result from the fax call report yet.   Dad thanked [place] for all of our efforts and ended the call.   SS, CCMA

## 2023-01-23 ENCOUNTER — Other Ambulatory Visit: Payer: Self-pay

## 2023-01-23 ENCOUNTER — Emergency Department
Admission: EM | Admit: 2023-01-23 | Discharge: 2023-01-23 | Disposition: A | Discharge status: Home / Self Care | Payer: Medicaid Other | Attending: Emergency Medicine | Admitting: Emergency Medicine

## 2023-01-23 DIAGNOSIS — K047 Periapical abscess without sinus: Secondary | ICD-10-CM | POA: Diagnosis not present

## 2023-01-23 DIAGNOSIS — R000 Tachycardia, unspecified: Secondary | ICD-10-CM | POA: Insufficient documentation

## 2023-01-23 DIAGNOSIS — K0889 Other specified disorders of teeth and supporting structures: Secondary | ICD-10-CM | POA: Diagnosis present

## 2023-01-23 LAB — BASIC METABOLIC PANEL
Anion gap: 8 (ref 5–15)
BUN: 11 mg/dL (ref 6–20)
CO2: 23 mmol/L (ref 22–32)
Calcium: 9.1 mg/dL (ref 8.9–10.3)
Chloride: 107 mmol/L (ref 98–111)
Creatinine, Ser: 0.46 mg/dL — ABNORMAL LOW (ref 0.61–1.24)
GFR, Estimated: >60 mL/min (ref >60)
Glucose, Bld: 90 mg/dL (ref 70–99)
Potassium: 4.2 mmol/L (ref 3.5–5.1)
Sodium: 138 mmol/L (ref 135–145)

## 2023-01-23 LAB — CBC
HCT: 47.5 % (ref 39.0–52.0)
Hemoglobin: 15.5 g/dL (ref 13.0–17.0)
MCH: 28.8 pg (ref 26.0–34.0)
MCHC: 32.6 g/dL (ref 30.0–36.0)
MCV: 88.1 fL (ref 80.0–100.0)
Platelets: 292 10*3/uL (ref 150–400)
RBC: 5.39 MIL/uL (ref 4.22–5.81)
RDW: 12.4 % (ref 11.5–15.5)
WBC: 11 10*3/uL — ABNORMAL HIGH (ref 4.0–10.5)
nRBC: 0 % (ref 0.0–0.2)

## 2023-01-23 LAB — LACTIC ACID, PLASMA
Lactic Acid, Venous: 2.5 mmol/L (ref 0.5–1.9)
Lactic Acid, Venous: 2.2 mmol/L (ref 0.5–1.9)

## 2023-01-23 MED ORDER — SODIUM CHLORIDE 0.9 % IV SOLN
3.0000 g | Freq: Once | INTRAVENOUS | Status: AC
  Administered 2023-01-23: 3 g via INTRAVENOUS
  Filled 2023-01-23: qty 8

## 2023-01-23 MED ORDER — SODIUM CHLORIDE 0.9 % IV BOLUS
1000.0000 mL | Freq: Once | INTRAVENOUS | Status: AC
  Administered 2023-01-23: 1000 mL via INTRAVENOUS

## 2023-01-23 MED ORDER — AMOXICILLIN-POT CLAVULANATE 875-125 MG PO TABS: 1.0000 | ORAL_TABLET | Freq: Two times a day (BID) | ORAL | 0 refills | Status: AC

## 2023-01-23 NOTE — ED Provider Notes (Signed)
[facility] Provider Note    Event Date/Time   First MD Initiated Contact with Patient 01/23/23 1931     (approximate)   History   Dental Pain   HPI  Mitchell Vasquez. is a 22 y.o. male presents to the emergency department today because of concerns for right sided dental swelling and pain.  Patient first noticed some swelling yesterday although it became worse today.  He denies any trauma to his teeth.  Denies biting on any hard knots or pits.  Denies any fevers.  Denies any difficulty with swallowing or breathing.     Physical Exam   Triage Vital Signs: ED Triage Vitals  Enc Vitals Group     BP 01/23/23 1743 (!) 135/98     Pulse Rate 01/23/23 1744 (!) 139     Resp 01/23/23 1743 16     Temp 01/23/23 1744 98.1 F (36.7 C)     Temp src --      SpO2 01/23/23 1744 97 %     Weight 01/23/23 1743 235 lb (106.6 kg)     Height --      Head Circumference --      Peak Flow --      Pain Score 01/23/23 1743 0     Pain Loc --      Pain Edu? --      Excl. in GC? --     Most recent vital signs: Vitals:   01/23/23 1743 01/23/23 1744  BP: (!) 135/98   Pulse:  (!) 139  Resp: 16   Temp:  98.1 F (36.7 C)  SpO2:  97%   General: Awake, alert, oriented. CV:  Good peripheral perfusion. Tachycardia, regular rhythm. Resp:  Normal effort. Lungs clear. Abd:  No distention.   ED Results / Procedures / Treatments   Labs (all labs ordered are listed, but only abnormal results are displayed) Labs Reviewed  CBC - Abnormal; Notable for the following components:      Result Value   WBC 11.0 (*)    All other components within normal limits  BASIC METABOLIC PANEL - Abnormal; Notable for the following components:   Creatinine, Ser 0.46 (*)    All other components within normal limits  LACTIC ACID, PLASMA - Abnormal; Notable for the following components:   Lactic Acid, Venous 2.5 (*)    All other components within normal limits  LACTIC ACID, PLASMA      EKG  None   RADIOLOGY None   PROCEDURES:  Critical Care performed: No   MEDICATIONS ORDERED IN ED: Medications - No data to display   IMPRESSION / MDM / ASSESSMENT AND PLAN / ED COURSE  I reviewed the triage vital signs and the nursing notes.                              Differential diagnosis includes, but is not limited to, dental infection, salivary gland stone  Patient's presentation is most consistent with acute presentation with potential threat to life or bodily function.   Patient presents to the emergency department today because of concern for right jaw swelling and pain.  On exam he does have swelling to his right mandible.  Does have poor dentition on that side.  Patient was afebrile here.  Did have some tachycardia initially.  Lactic acid level is also slightly elevated.  Was given IV fluids which helped both with his lactic  acid level as well as his heart rate.  Do think he was likely somewhat dehydrated.  Additionally the patient is somewhat uncomfortable being in the stretcher compared to his wheelchair and home arrangement.  Did have a discussion with the patient.  At this time I have low concern for systemic disease.  Do think this is likely localized to dental infection.  He was given dose of IV antibiotics here.  I do think is reasonable for patient to be discharged at this time with further antibiotics.  Patient felt comfortable with the plan.    FINAL CLINICAL IMPRESSION(S) / ED DIAGNOSES   Final diagnoses:  Dental infection     Note:  This document was prepared using Dragon voice recognition software and may include unintentional dictation errors.    Phineas Semen, MD 01/23/23 [phone]

## 2023-01-23 NOTE — Discharge Instructions (Signed)
Please seek medical attention for any high fevers, chest pain, shortness of breath, change in behavior, persistent vomiting, bloody stool or any other new or concerning symptoms.  

## 2023-01-23 NOTE — ED Notes (Signed)
Pt verbalizes understanding of discharge instructions. Opportunity for questioning and answers were provided. Pt discharged from ED to home with father.    

## 2023-01-23 NOTE — ED Triage Notes (Signed)
Pt to ED for right lower dental pain started this morning. Pt also has some swelling. Denies fevers. No swelling noted to tongue or lips.
